# Patient Record
Sex: Male | Born: 1965 | Race: White | Hispanic: No | Marital: Single | Smoking: Current every day smoker
Health system: Southern US, Community
[De-identification: ages and names within clinical notes are randomized; demographics above are authoritative.]

## PROBLEM LIST (undated history)

## (undated) DIAGNOSIS — K759 Inflammatory liver disease, unspecified: Secondary | ICD-10-CM

## (undated) DIAGNOSIS — S3992XA Unspecified injury of lower back, initial encounter: Secondary | ICD-10-CM

## (undated) HISTORY — PX: NO PAST SURGERIES: SHX2092

---

## 2009-10-06 ENCOUNTER — Emergency Department (HOSPITAL_COMMUNITY): Admission: EM | Admit: 2009-10-06 | Discharge: 2009-10-06 | Payer: Self-pay | Admitting: Emergency Medicine

## 2009-12-27 ENCOUNTER — Emergency Department (HOSPITAL_COMMUNITY): Admission: EM | Admit: 2009-12-27 | Discharge: 2009-12-27 | Payer: Self-pay | Admitting: Emergency Medicine

## 2010-01-10 ENCOUNTER — Emergency Department (HOSPITAL_COMMUNITY): Admission: EM | Admit: 2010-01-10 | Discharge: 2010-01-10 | Payer: Self-pay | Admitting: Emergency Medicine

## 2010-01-17 ENCOUNTER — Emergency Department (HOSPITAL_COMMUNITY): Admission: EM | Admit: 2010-01-17 | Discharge: 2010-01-17 | Payer: Self-pay | Admitting: Emergency Medicine

## 2010-10-07 LAB — URINALYSIS, ROUTINE W REFLEX MICROSCOPIC
Nitrite: NEGATIVE
Urobilinogen, UA: 0.2 mg/dL (ref 0.0–1.0)

## 2011-04-02 ENCOUNTER — Emergency Department (HOSPITAL_COMMUNITY): Payer: Self-pay

## 2011-04-02 ENCOUNTER — Emergency Department (HOSPITAL_COMMUNITY)
Admission: EM | Admit: 2011-04-02 | Discharge: 2011-04-02 | Disposition: A | Discharge status: Home / Self Care | Payer: Self-pay | Attending: Emergency Medicine | Admitting: Emergency Medicine

## 2011-04-02 DIAGNOSIS — R4182 Altered mental status, unspecified: Secondary | ICD-10-CM | POA: Insufficient documentation

## 2011-04-02 DIAGNOSIS — F1910 Other psychoactive substance abuse, uncomplicated: Secondary | ICD-10-CM | POA: Insufficient documentation

## 2011-04-02 LAB — COMPREHENSIVE METABOLIC PANEL
ALT: 26 U/L (ref 0–53)
AST: 30 U/L (ref 0–37)
Albumin: 3.8 g/dL (ref 3.5–5.2)
Alkaline Phosphatase: 68 U/L (ref 39–117)
BUN: 15 mg/dL (ref 6–23)
CO2: 29 mEq/L (ref 19–32)
Calcium: 9.1 mg/dL (ref 8.4–10.5)
Chloride: 101 mEq/L (ref 96–112)
Creatinine, Ser: 1.18 mg/dL (ref 0.50–1.35)
GFR calc Af Amer: >60 mL/min (ref >60)
GFR calc non Af Amer: >60 mL/min (ref >60)
Glucose, Bld: 90 mg/dL (ref 70–99)
Potassium: 4.1 mEq/L (ref 3.5–5.1)
Sodium: 137 mEq/L (ref 135–145)
Total Bilirubin: 0.4 mg/dL (ref 0.3–1.2)
Total Protein: 6.8 g/dL (ref 6.0–8.3)

## 2011-04-02 LAB — RAPID URINE DRUG SCREEN, HOSP PERFORMED: Amphetamines: NOT DETECTED

## 2011-04-02 LAB — CBC
MCV: 87.8 fL (ref 78.0–100.0)
Platelets: 259 10*3/uL (ref 150–400)
RBC: 5.25 MIL/uL (ref 4.22–5.81)

## 2011-04-02 LAB — ETHANOL: Alcohol, Ethyl (B): <11 mg/dL (ref 0–11)

## 2012-06-09 ENCOUNTER — Encounter (HOSPITAL_COMMUNITY): Payer: Self-pay

## 2012-06-09 ENCOUNTER — Emergency Department (HOSPITAL_COMMUNITY): Payer: No Typology Code available for payment source

## 2012-06-09 ENCOUNTER — Emergency Department (HOSPITAL_COMMUNITY)
Admission: EM | Admit: 2012-06-09 | Discharge: 2012-06-09 | Disposition: A | Discharge status: Home / Self Care | Payer: No Typology Code available for payment source | Attending: Emergency Medicine | Admitting: Emergency Medicine

## 2012-06-09 DIAGNOSIS — F17200 Nicotine dependence, unspecified, uncomplicated: Secondary | ICD-10-CM | POA: Insufficient documentation

## 2012-06-09 DIAGNOSIS — S139XXA Sprain of joints and ligaments of unspecified parts of neck, initial encounter: Secondary | ICD-10-CM | POA: Insufficient documentation

## 2012-06-09 DIAGNOSIS — Y929 Unspecified place or not applicable: Secondary | ICD-10-CM | POA: Insufficient documentation

## 2012-06-09 DIAGNOSIS — S161XXA Strain of muscle, fascia and tendon at neck level, initial encounter: Secondary | ICD-10-CM

## 2012-06-09 DIAGNOSIS — Y939 Activity, unspecified: Secondary | ICD-10-CM | POA: Insufficient documentation

## 2012-06-09 DIAGNOSIS — S4980XA Other specified injuries of shoulder and upper arm, unspecified arm, initial encounter: Secondary | ICD-10-CM | POA: Insufficient documentation

## 2012-06-09 DIAGNOSIS — S46909A Unspecified injury of unspecified muscle, fascia and tendon at shoulder and upper arm level, unspecified arm, initial encounter: Secondary | ICD-10-CM | POA: Insufficient documentation

## 2012-06-09 MED ORDER — HYDROCODONE-ACETAMINOPHEN 5-500 MG PO TABS: 1.0000 | ORAL_TABLET | Freq: Four times a day (QID) | ORAL | Status: DC | PRN
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
## 2012-06-17 ENCOUNTER — Encounter (HOSPITAL_COMMUNITY): Payer: Self-pay | Admitting: *Deleted

## 2012-06-17 ENCOUNTER — Emergency Department (HOSPITAL_COMMUNITY)
Admission: EM | Admit: 2012-06-17 | Discharge: 2012-06-17 | Disposition: A | Discharge status: Home / Self Care | Payer: No Typology Code available for payment source | Attending: Emergency Medicine | Admitting: Emergency Medicine

## 2012-06-17 DIAGNOSIS — F17200 Nicotine dependence, unspecified, uncomplicated: Secondary | ICD-10-CM | POA: Insufficient documentation

## 2012-06-17 DIAGNOSIS — M25519 Pain in unspecified shoulder: Secondary | ICD-10-CM | POA: Insufficient documentation

## 2012-06-17 DIAGNOSIS — M2550 Pain in unspecified joint: Secondary | ICD-10-CM | POA: Insufficient documentation

## 2012-06-17 MED ORDER — HYDROCODONE-ACETAMINOPHEN 5-325 MG PO TABS: ORAL_TABLET | ORAL | Status: DC

## 2012-06-17 MED ORDER — METHOCARBAMOL 500 MG PO TABS: 500.0000 mg | ORAL_TABLET | Freq: Three times a day (TID) | ORAL | Status: DC

## 2012-06-17 MED ORDER — MELOXICAM 7.5 MG PO TABS: ORAL_TABLET | ORAL | Status: DC

## 2012-06-17 NOTE — ED Provider Notes (Signed)
Medical screening examination/treatment/procedure(s) were performed by non-physician practitioner and as supervising physician I was immediately available for consultation/collaboration. Devoria Albe, MD, Armando Gang   Ward Givens, MD 06/17/12 2230
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
## 2012-06-17 NOTE — ED Notes (Signed)
EDPa in room with pt. 

## 2012-07-13 ENCOUNTER — Encounter (HOSPITAL_COMMUNITY): Payer: Self-pay | Admitting: *Deleted

## 2012-07-13 ENCOUNTER — Emergency Department (HOSPITAL_COMMUNITY)
Admission: EM | Admit: 2012-07-13 | Discharge: 2012-07-13 | Disposition: A | Discharge status: Home / Self Care | Payer: No Typology Code available for payment source | Attending: Emergency Medicine | Admitting: Emergency Medicine

## 2012-07-13 DIAGNOSIS — F17200 Nicotine dependence, unspecified, uncomplicated: Secondary | ICD-10-CM | POA: Insufficient documentation

## 2012-07-13 DIAGNOSIS — M25519 Pain in unspecified shoulder: Secondary | ICD-10-CM | POA: Insufficient documentation

## 2012-07-13 DIAGNOSIS — M25512 Pain in left shoulder: Secondary | ICD-10-CM

## 2012-07-13 MED ORDER — OXYCODONE-ACETAMINOPHEN 5-325 MG PO TABS: ORAL_TABLET | ORAL | Status: DC
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
## 2012-07-14 NOTE — ED Provider Notes (Signed)
Medical screening examination/treatment/procedure(s) were performed by non-physician practitioner and as supervising physician I was immediately available for consultation/collaboration. Brinleigh Tew, MD, FACEP   Addaline Peplinski L Cheyanna Strick, MD 07/14/12 0114 

## 2012-07-30 ENCOUNTER — Encounter (HOSPITAL_COMMUNITY): Payer: Self-pay | Admitting: *Deleted

## 2012-07-30 ENCOUNTER — Emergency Department (HOSPITAL_COMMUNITY)
Admission: EM | Admit: 2012-07-30 | Discharge: 2012-07-30 | Disposition: A | Discharge status: Home / Self Care | Payer: No Typology Code available for payment source | Attending: Emergency Medicine | Admitting: Emergency Medicine

## 2012-07-30 DIAGNOSIS — G8929 Other chronic pain: Secondary | ICD-10-CM | POA: Insufficient documentation

## 2012-07-30 DIAGNOSIS — Z87828 Personal history of other (healed) physical injury and trauma: Secondary | ICD-10-CM | POA: Insufficient documentation

## 2012-07-30 DIAGNOSIS — M25519 Pain in unspecified shoulder: Secondary | ICD-10-CM | POA: Insufficient documentation

## 2012-07-30 DIAGNOSIS — F17200 Nicotine dependence, unspecified, uncomplicated: Secondary | ICD-10-CM | POA: Insufficient documentation

## 2012-07-30 MED ORDER — OXYCODONE-ACETAMINOPHEN 5-325 MG PO TABS: 1.0000 | ORAL_TABLET | Freq: Four times a day (QID) | ORAL | Status: AC | PRN

## 2012-07-30 MED ORDER — IBUPROFEN 800 MG PO TABS: 800.0000 mg | ORAL_TABLET | Freq: Three times a day (TID) | ORAL | Status: DC
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
## 2012-07-31 NOTE — ED Provider Notes (Signed)
Medical screening examination/treatment/procedure(s) were performed by non-physician practitioner and as supervising physician I was immediately available for consultation/collaboration.   Aster Eckrich M Dhilan Brauer, DO 07/31/12 1943 

## 2012-11-05 ENCOUNTER — Emergency Department (HOSPITAL_COMMUNITY): Payer: Self-pay

## 2012-11-05 ENCOUNTER — Encounter (HOSPITAL_COMMUNITY): Payer: Self-pay

## 2012-11-05 ENCOUNTER — Emergency Department (HOSPITAL_COMMUNITY)
Admission: EM | Admit: 2012-11-05 | Discharge: 2012-11-05 | Disposition: A | Discharge status: Home / Self Care | Payer: Self-pay | Attending: Emergency Medicine | Admitting: Emergency Medicine

## 2012-11-05 DIAGNOSIS — W07XXXA Fall from chair, initial encounter: Secondary | ICD-10-CM | POA: Insufficient documentation

## 2012-11-05 DIAGNOSIS — S32009A Unspecified fracture of unspecified lumbar vertebra, initial encounter for closed fracture: Secondary | ICD-10-CM | POA: Insufficient documentation

## 2012-11-05 DIAGNOSIS — Y9289 Other specified places as the place of occurrence of the external cause: Secondary | ICD-10-CM | POA: Insufficient documentation

## 2012-11-05 DIAGNOSIS — Y9389 Activity, other specified: Secondary | ICD-10-CM | POA: Insufficient documentation

## 2012-11-05 DIAGNOSIS — F17200 Nicotine dependence, unspecified, uncomplicated: Secondary | ICD-10-CM | POA: Insufficient documentation

## 2012-11-05 DIAGNOSIS — W1809XA Striking against other object with subsequent fall, initial encounter: Secondary | ICD-10-CM | POA: Insufficient documentation

## 2012-11-05 MED ORDER — OXYCODONE-ACETAMINOPHEN 5-325 MG PO TABS: 1.0000 | ORAL_TABLET | ORAL | Status: DC | PRN

## 2012-11-05 MED ORDER — OXYCODONE-ACETAMINOPHEN 5-325 MG PO TABS
2.0000 | ORAL_TABLET | Freq: Once | ORAL | Status: DC
  Filled 2012-11-05: qty 2

## 2012-11-05 NOTE — ED Notes (Signed)
Pt reports fell from chair trying to reach a crockpot on the top shelf of a cabinet.  C/O lower back pain.

## 2012-11-07 NOTE — ED Provider Notes (Signed)
History     CSN: [id number]  Arrival date & time 11/05/12  1407   First MD Initiated Contact with Patient 11/05/12 1436      Chief Complaint  Patient presents with  . Back Pain    (Consider location/radiation/quality/duration/timing/severity/associated sxs/prior treatment) HPI Comments: Gabriel Fisher is a 47 y.o. Male presenting with left lateral low back pain he sustained when he fell off a chair reaching for his crockpot,  Landing against the stove edge with his left lower back.  The injury just prior to arrival.  He describes sharp pain that is not radiating and is worse with movement and palpation.  He reports he could not move for about 15 minutes after the injury as it hurt too bad.  He denies any other pain or injury; he did not hit his head and had no loc during the event. He has no numbness or weakness in his legs.   He has taken no medications prior to arrival.  Sitting still relieves his pain.     The history is provided by the patient.    History reviewed. No pertinent past medical history.  History reviewed. No pertinent past surgical history.  No family history on file.  History  Substance Use Topics  . Smoking status: Current Every Day Smoker    Types: Cigarettes  . Smokeless tobacco: Not on file  . Alcohol Use: No      Review of Systems  Constitutional: Negative for fever.  Respiratory: Negative for shortness of breath.   Cardiovascular: Negative for chest pain and leg swelling.  Gastrointestinal: Negative for abdominal pain, constipation and abdominal distention.  Genitourinary: Negative for dysuria, urgency, frequency, flank pain and difficulty urinating.  Musculoskeletal: Positive for back pain. Negative for joint swelling and gait problem.  Skin: Negative for rash.  Neurological: Negative for weakness and numbness.    Allergies  Penicillins  Home Medications   Current Outpatient Rx  Name  Route  Sig  Dispense  Refill  .  oxyCODONE-acetaminophen (PERCOCET/ROXICET) 5-325 MG per tablet   Oral   Take 1-2 tablets by mouth every 4 (four) hours as needed for pain.   30 tablet   0     BP 108/64  Pulse 74  Temp(Src) 98.2 F (36.8 C) (Oral)  Resp 20  Ht 5\' 9"  (1.753 m)  Wt 200 lb (90.719 kg)  BMI 29.52 kg/m2  SpO2 100%  Physical Exam  Nursing note and vitals reviewed. Constitutional: He appears well-developed and well-nourished.  HENT:  Head: Normocephalic.  Eyes: Conjunctivae are normal.  Neck: Normal range of motion. No spinous process tenderness present.  Cardiovascular: Normal rate and intact distal pulses.   Pedal pulses normal.  Pulmonary/Chest: Effort normal.  Abdominal: Soft. Bowel sounds are normal. He exhibits no distension and no mass.  Musculoskeletal: Normal range of motion. He exhibits no edema.       Lumbar back: He exhibits tenderness. He exhibits no swelling, no edema and no spasm.  Neurological: He is alert. He has normal strength. He displays no tremor. No sensory deficit. He exhibits normal muscle tone. Gait normal.  Reflex Scores:      Patellar reflexes are 2+ on the right side and 2+ on the left side.      Achilles reflexes are 2+ on the right side and 2+ on the left side. No strength deficit noted in hip and knee flexor and extensor muscle groups.  Ankle flexion and extension intact.  Skin: Skin is warm and  dry.  Psychiatric: He has a normal mood and affect.    ED Course  Procedures (including critical care time)  Labs Reviewed - No data to display Dg Lumbar Spine Complete  11/05/2012  *RADIOLOGY REPORT*  Clinical Data: Low back pain post fall, especially on left  LUMBAR SPINE - COMPLETE 4+ VIEW  Comparison: 10/06/2009  Findings: Osseous mineralization normal. Slight disc space narrowing and L3-L4. Vertebral body and disc space heights otherwise maintained. Deformity of left transverse process of L2 is new since previous exam but without definite acute fracture plane  consistent with an age indeterminate fracture. Additional deformity of left transverse process of L3 is suspicious for an acute fracture. No additional fractures identified. SI joints symmetric.  IMPRESSION: Suspect acute fracture left transverse process of L3. Deformity left transverse process L2 new since previous exam consistent with an age indeterminate fracture   Original Report Authenticated By: Ulyses Southward, M.D.      1. Lumbar transverse process fracture, closed, initial encounter       MDM  Patients labs and/or radiological studies were viewed and considered during the medical decision making and disposition process.  xrays were reviewed with patient.  He was advised to minimize activity such as weight bearing,  Bending, twisting, work note given for light duty.  He was prescribed percocet,  Encouraged ice tx for the next 2 days,  Then can add heat.  Referral to ortho for a recheck of his injury and to follow as this injury heals - pt to call for appt within the next week.  In interim,  Encouraged return here for any new or worsened sx.  The patient appears reasonably screened and/or stabilized for discharge and I doubt any other medical condition or other [facility] requiring further screening, evaluation, or treatment in the ED at this time prior to discharge.         Burgess Amor, PA-C 11/07/12 [phone]

## 2012-11-07 NOTE — ED Provider Notes (Signed)
Medical screening examination/treatment/procedure(s) were conducted as a shared visit with non-physician practitioner(s) and myself.  I personally evaluated the patient during the encounter.  X-rays reviewed. No neurological deficits.  Fractures noted.  Referral to orthopedics  Donnetta Hutching, MD 11/07/12 [phone]

## 2012-11-09 ENCOUNTER — Encounter (HOSPITAL_COMMUNITY): Payer: Self-pay | Admitting: *Deleted

## 2012-11-09 ENCOUNTER — Emergency Department (HOSPITAL_COMMUNITY)
Admission: EM | Admit: 2012-11-09 | Discharge: 2012-11-09 | Discharge status: Against Medical Advice | Payer: Self-pay | Attending: Emergency Medicine | Admitting: Emergency Medicine

## 2012-11-09 DIAGNOSIS — Z880 Allergy status to penicillin: Secondary | ICD-10-CM | POA: Insufficient documentation

## 2012-11-09 DIAGNOSIS — M549 Dorsalgia, unspecified: Secondary | ICD-10-CM | POA: Insufficient documentation

## 2012-11-09 DIAGNOSIS — F17200 Nicotine dependence, unspecified, uncomplicated: Secondary | ICD-10-CM | POA: Insufficient documentation

## 2012-11-09 MED ORDER — OXYCODONE-ACETAMINOPHEN 7.5-325 MG PO TABS: 1.0000 | ORAL_TABLET | ORAL | Status: DC | PRN
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
## 2012-11-09 NOTE — ED Notes (Signed)
Sent by Dr. Hilda Lias for F/u.  Dr. Hilda Lias to see in ED.

## 2012-11-09 NOTE — Discharge Summary (Signed)
Physician Discharge Summary  Patient ID: Gabriel Fisher MRN: [id number] DOB/AGE: 1966/06/03 47 y.o.  Admit date: 11/09/2012 Discharge date: 11/09/2012  Admission Diagnoses: Fracture transverse process of L2 and L3 on the left  Discharge Diagnoses: same Active Problems:   * No active hospital problems. *   Discharged Condition: good  Hospital Course: He was seen by me in the ER.  He was told of problem and given Rx for pain  Consults: None  Significant Diagnostic Studies: radiology: X-Ray: transverse process fracture of L2 and L3 done on 4/19.  Treatments: Examination, pain medicine  Discharge Exam: Blood pressure 113/71, pulse 72, temperature 97.9 F (36.6 C), temperature source Oral, resp. rate 16, height 5\' 9"  (1.753 m), weight 90.719 kg (200 lb), SpO2 99.00%. General appearance: alert, cooperative and appears stated age  Disposition: 01-Home or Self Care     Medication List    STOP taking these medications       oxyCODONE-acetaminophen 5-325 MG per tablet  Commonly known as:  PERCOCET/ROXICET      TAKE these medications       oxyCODONE-acetaminophen 7.5-325 MG per tablet  Commonly known as:  PERCOCET  Take 1 tablet by mouth every 4 (four) hours as needed for pain.           Follow-up Information   Follow up with Darreld Mclean, MD.   Contact information:   [address] [place] [address] [phone]       Signed: Darreld Mclean 11/09/2012, 12:05 PM

## 2012-12-17 ENCOUNTER — Other Ambulatory Visit (HOSPITAL_COMMUNITY): Payer: Self-pay | Admitting: Orthopaedic Surgery

## 2012-12-17 ENCOUNTER — Ambulatory Visit (HOSPITAL_COMMUNITY)
Admission: RE | Admit: 2012-12-17 | Discharge: 2012-12-17 | Disposition: A | Discharge status: Home / Self Care | Payer: Self-pay | Source: Ambulatory Visit | Attending: Orthopaedic Surgery | Admitting: Orthopaedic Surgery

## 2012-12-17 DIAGNOSIS — S32039A Unspecified fracture of third lumbar vertebra, initial encounter for closed fracture: Secondary | ICD-10-CM

## 2012-12-17 DIAGNOSIS — S32029A Unspecified fracture of second lumbar vertebra, initial encounter for closed fracture: Secondary | ICD-10-CM

## 2012-12-17 DIAGNOSIS — S32009A Unspecified fracture of unspecified lumbar vertebra, initial encounter for closed fracture: Secondary | ICD-10-CM | POA: Insufficient documentation

## 2012-12-17 DIAGNOSIS — W19XXXA Unspecified fall, initial encounter: Secondary | ICD-10-CM | POA: Insufficient documentation

## 2013-08-22 ENCOUNTER — Emergency Department (HOSPITAL_COMMUNITY): Payer: Self-pay

## 2013-08-22 ENCOUNTER — Emergency Department (HOSPITAL_COMMUNITY)
Admission: EM | Admit: 2013-08-22 | Discharge: 2013-08-23 | Disposition: A | Discharge status: Home / Self Care | Payer: Self-pay | Attending: Emergency Medicine | Admitting: Emergency Medicine

## 2013-08-22 ENCOUNTER — Encounter (HOSPITAL_COMMUNITY): Payer: Self-pay | Admitting: Emergency Medicine

## 2013-08-22 DIAGNOSIS — S43036A Inferior dislocation of unspecified humerus, initial encounter: Secondary | ICD-10-CM | POA: Insufficient documentation

## 2013-08-22 DIAGNOSIS — S0083XA Contusion of other part of head, initial encounter: Secondary | ICD-10-CM

## 2013-08-22 DIAGNOSIS — S1093XA Contusion of unspecified part of neck, initial encounter: Secondary | ICD-10-CM

## 2013-08-22 DIAGNOSIS — S43031A Inferior subluxation of right humerus, initial encounter: Secondary | ICD-10-CM

## 2013-08-22 DIAGNOSIS — S52123A Displaced fracture of head of unspecified radius, initial encounter for closed fracture: Secondary | ICD-10-CM

## 2013-08-22 DIAGNOSIS — Y9301 Activity, walking, marching and hiking: Secondary | ICD-10-CM | POA: Insufficient documentation

## 2013-08-22 DIAGNOSIS — S0003XA Contusion of scalp, initial encounter: Secondary | ICD-10-CM | POA: Insufficient documentation

## 2013-08-22 DIAGNOSIS — S52109A Unspecified fracture of upper end of unspecified radius, initial encounter for closed fracture: Secondary | ICD-10-CM | POA: Insufficient documentation

## 2013-08-22 DIAGNOSIS — F17200 Nicotine dependence, unspecified, uncomplicated: Secondary | ICD-10-CM | POA: Insufficient documentation

## 2013-08-22 DIAGNOSIS — IMO0002 Reserved for concepts with insufficient information to code with codable children: Secondary | ICD-10-CM | POA: Insufficient documentation

## 2013-08-22 DIAGNOSIS — S0093XA Contusion of unspecified part of head, initial encounter: Secondary | ICD-10-CM

## 2013-08-22 DIAGNOSIS — Y92410 Unspecified street and highway as the place of occurrence of the external cause: Secondary | ICD-10-CM | POA: Insufficient documentation

## 2013-08-22 DIAGNOSIS — T07XXXA Unspecified multiple injuries, initial encounter: Secondary | ICD-10-CM

## 2013-08-22 MED ORDER — HYDROMORPHONE HCL PF 1 MG/ML IJ SOLN
1.0000 mg | Freq: Once | INTRAMUSCULAR | Status: AC
  Administered 2013-08-22: 1 mg via INTRAMUSCULAR
  Filled 2013-08-22: qty 1

## 2013-08-22 MED ORDER — METHOCARBAMOL 500 MG PO TABS
750.0000 mg | ORAL_TABLET | Freq: Once | ORAL | Status: AC
  Administered 2013-08-22: 750 mg via ORAL
  Filled 2013-08-22: qty 2
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
## 2013-08-22 NOTE — ED Notes (Signed)
Pt states he went to bathroom on way to x-ray, unable to urinate at this time.

## 2013-08-22 NOTE — ED Notes (Signed)
Pt gone for CT at this time
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
## 2013-08-23 ENCOUNTER — Emergency Department (HOSPITAL_COMMUNITY): Payer: Self-pay

## 2013-08-23 LAB — URINALYSIS, ROUTINE W REFLEX MICROSCOPIC
Bilirubin Urine: NEGATIVE
Glucose, UA: NEGATIVE mg/dL
Hgb urine dipstick: NEGATIVE
KETONES UR: NEGATIVE mg/dL
LEUKOCYTES UA: NEGATIVE
NITRITE: NEGATIVE
Protein, ur: NEGATIVE mg/dL
SPECIFIC GRAVITY, URINE: 1.019 (ref 1.005–1.030)
Urobilinogen, UA: 1 mg/dL (ref 0.0–1.0)
pH: 6.5 (ref 5.0–8.0)

## 2013-08-23 MED ORDER — HYDROMORPHONE HCL PF 1 MG/ML IJ SOLN
1.0000 mg | Freq: Once | INTRAMUSCULAR | Status: AC
  Administered 2013-08-23: 1 mg via INTRAMUSCULAR
  Filled 2013-08-23: qty 1

## 2013-08-23 MED ORDER — HYDROCODONE-ACETAMINOPHEN 5-325 MG PO TABS: 1.0000 | ORAL_TABLET | ORAL | Status: DC | PRN

## 2013-08-23 NOTE — Discharge Instructions (Signed)
1. Medications: vicodin for pain, usual home medications 2. Treatment: rest, drink plenty of fluids, keep splint clean and dry, keep wounds clean with warm soap and water 3. Follow Up: Please followup with Dr. Sherlean FootLucey for further evaluation and management of your injuries   Cast or Splint Care Casts and splints support injured limbs and keep bones from moving while they heal.  HOME CARE  Keep the cast or splint uncovered during the drying period.  A plaster cast can take 24 to 48 hours to dry.  A fiberglass cast will dry in less than 1 hour.  Do not rest the cast on anything harder than a pillow for 24 hours.  Do not put weight on your injured limb. Do not put pressure on the cast. Wait for your doctor's approval.  Keep the cast or splint dry.  Cover the cast or splint with a plastic bag during baths or wet weather.  If you have a cast over your chest and belly (trunk), take sponge baths until the cast is taken off.  If your cast gets wet, dry it with a towel or blow dryer. Use the cool setting on the blow dryer.  Keep your cast or splint clean. Wash a dirty cast with a damp cloth.  Do not put any objects under your cast or splint.  Do not scratch the skin under the cast with an object. If itching is a problem, use a blow dryer on a cool setting over the itchy area.  Do not trim or cut your cast.  Do not take out the padding from inside your cast.  Exercise your joints near the cast as told by your doctor.  Raise (elevate) your injured limb on 1 or 2 pillows for the first 1 to 3 days. GET HELP IF:  Your cast or splint cracks.  Your cast or splint is too tight or too loose.  You itch badly under the cast.  Your cast gets wet or has a soft spot.  You have a bad smell coming from the cast.  You get an object stuck under the cast.  Your skin around the cast becomes red or sore.  You have new or more pain after the cast is put on. GET HELP RIGHT AWAY IF:  You have  fluid leaking through the cast.  You cannot move your fingers or toes.  Your fingers or toes turn blue or white or are cool, painful, or puffy (swollen).  You have tingling or lose feeling (numbness) around the injured area.  You have bad pain or pressure under the cast.  You have trouble breathing or have shortness of breath.  You have chest pain. Document Released: 11/07/2010 Document Revised: 03/10/2013 Document Reviewed: 01/14/2013 [facility] Patient Information 2014 [employer], [place].

## 2013-08-23 NOTE — Progress Notes (Signed)
Orthopedic Tech Progress Note Patient Details:  Gabriel Fisher 02/11/1966 [id number]  Ortho Devices Type of Ortho Device: Arm sling;Sugartong splint   [name], [name] 08/23/2013, 1:26 AM

## 2013-08-23 NOTE — ED Notes (Signed)
Ortho tech called to apply splint and sling. 

## 2013-08-24 NOTE — ED Provider Notes (Signed)
Medical screening examination/treatment/procedure(s) were performed by non-physician practitioner and as supervising physician I was immediately available for consultation/collaboration.  EKG Interpretation   None         Lynell Kussman M Kentley Blyden, MD 08/24/13 1618 

## 2013-08-25 ENCOUNTER — Emergency Department (HOSPITAL_COMMUNITY): Payer: Self-pay

## 2013-08-25 ENCOUNTER — Emergency Department (HOSPITAL_COMMUNITY): Payer: No Typology Code available for payment source

## 2013-08-25 ENCOUNTER — Encounter (HOSPITAL_COMMUNITY): Payer: Self-pay | Admitting: Emergency Medicine

## 2013-08-25 ENCOUNTER — Inpatient Hospital Stay (HOSPITAL_COMMUNITY)
Admission: EM | Admit: 2013-08-25 | Discharge: 2013-08-27 | DRG: 443 | Disposition: A | Discharge status: Home / Self Care | Payer: Self-pay | Attending: General Surgery | Admitting: General Surgery

## 2013-08-25 DIAGNOSIS — S36119A Unspecified injury of liver, initial encounter: Secondary | ICD-10-CM

## 2013-08-25 DIAGNOSIS — D72829 Elevated white blood cell count, unspecified: Secondary | ICD-10-CM

## 2013-08-25 DIAGNOSIS — T391X5A Adverse effect of 4-Aminophenol derivatives, initial encounter: Secondary | ICD-10-CM | POA: Diagnosis present

## 2013-08-25 DIAGNOSIS — M25519 Pain in unspecified shoulder: Secondary | ICD-10-CM | POA: Diagnosis present

## 2013-08-25 DIAGNOSIS — B1920 Unspecified viral hepatitis C without hepatic coma: Secondary | ICD-10-CM | POA: Diagnosis present

## 2013-08-25 DIAGNOSIS — R7989 Other specified abnormal findings of blood chemistry: Secondary | ICD-10-CM

## 2013-08-25 DIAGNOSIS — S40011A Contusion of right shoulder, initial encounter: Secondary | ICD-10-CM | POA: Diagnosis present

## 2013-08-25 DIAGNOSIS — Y92009 Unspecified place in unspecified non-institutional (private) residence as the place of occurrence of the external cause: Secondary | ICD-10-CM

## 2013-08-25 DIAGNOSIS — K7689 Other specified diseases of liver: Principal | ICD-10-CM | POA: Diagnosis present

## 2013-08-25 DIAGNOSIS — S5291XA Unspecified fracture of right forearm, initial encounter for closed fracture: Secondary | ICD-10-CM | POA: Diagnosis present

## 2013-08-25 DIAGNOSIS — F17200 Nicotine dependence, unspecified, uncomplicated: Secondary | ICD-10-CM | POA: Diagnosis present

## 2013-08-25 DIAGNOSIS — R1011 Right upper quadrant pain: Secondary | ICD-10-CM

## 2013-08-25 DIAGNOSIS — S52123A Displaced fracture of head of unspecified radius, initial encounter for closed fracture: Secondary | ICD-10-CM | POA: Diagnosis present

## 2013-08-25 DIAGNOSIS — K759 Inflammatory liver disease, unspecified: Secondary | ICD-10-CM | POA: Diagnosis present

## 2013-08-25 DIAGNOSIS — K760 Fatty (change of) liver, not elsewhere classified: Secondary | ICD-10-CM | POA: Diagnosis present

## 2013-08-25 DIAGNOSIS — R945 Abnormal results of liver function studies: Secondary | ICD-10-CM

## 2013-08-25 DIAGNOSIS — IMO0002 Reserved for concepts with insufficient information to code with codable children: Secondary | ICD-10-CM

## 2013-08-25 HISTORY — DX: Unspecified injury of lower back, initial encounter: S39.92XA

## 2013-08-25 LAB — CBC WITH DIFFERENTIAL/PLATELET
BASOS PCT: 1 % (ref 0–1)
Basophils Absolute: 0.2 10*3/uL — ABNORMAL HIGH (ref 0.0–0.1)
EOS PCT: 1 % (ref 0–5)
Eosinophils Absolute: 0.2 10*3/uL (ref 0.0–0.7)
HEMATOCRIT: 47.5 % (ref 39.0–52.0)
Hemoglobin: 16 g/dL (ref 13.0–17.0)
LYMPHS PCT: 63 % — AB (ref 12–46)
Lymphs Abs: 10.6 10*3/uL — ABNORMAL HIGH (ref 0.7–4.0)
MCH: 29.2 pg (ref 26.0–34.0)
MCHC: 33.7 g/dL (ref 30.0–36.0)
MCV: 86.7 fL (ref 78.0–100.0)
MONOS PCT: 8 % (ref 3–12)
Monocytes Absolute: 1.3 10*3/uL — ABNORMAL HIGH (ref 0.1–1.0)
NEUTROS ABS: 4.5 10*3/uL (ref 1.7–7.7)
NEUTROS PCT: 27 % — AB (ref 43–77)
Platelets: 185 10*3/uL (ref 150–400)
RBC: 5.48 MIL/uL (ref 4.22–5.81)
RDW: 14.1 % (ref 11.5–15.5)
WBC: 16.8 10*3/uL — ABNORMAL HIGH (ref 4.0–10.5)

## 2013-08-25 LAB — URINALYSIS, ROUTINE W REFLEX MICROSCOPIC
Glucose, UA: NEGATIVE mg/dL
Hgb urine dipstick: NEGATIVE
Ketones, ur: NEGATIVE mg/dL
Leukocytes, UA: NEGATIVE
Nitrite: NEGATIVE
PH: 5.5 (ref 5.0–8.0)
PROTEIN: NEGATIVE mg/dL
Specific Gravity, Urine: 1.02 (ref 1.005–1.030)
Urobilinogen, UA: 1 mg/dL (ref 0.0–1.0)

## 2013-08-25 LAB — PROTIME-INR
INR: 1.08 (ref 0.00–1.49)
Prothrombin Time: 13.8 seconds (ref 11.6–15.2)

## 2013-08-25 LAB — COMPREHENSIVE METABOLIC PANEL
ALK PHOS: 198 U/L — AB (ref 39–117)
ALT: 1934 U/L — AB (ref 0–53)
AST: 1215 U/L — ABNORMAL HIGH (ref 0–37)
Albumin: 2.9 g/dL — ABNORMAL LOW (ref 3.5–5.2)
BILIRUBIN TOTAL: 2.8 mg/dL — AB (ref 0.3–1.2)
BUN: 11 mg/dL (ref 6–23)
CO2: 24 meq/L (ref 19–32)
Calcium: 8.3 mg/dL — ABNORMAL LOW (ref 8.4–10.5)
Chloride: 100 mEq/L (ref 96–112)
Creatinine, Ser: 0.97 mg/dL (ref 0.50–1.35)
GLUCOSE: 188 mg/dL — AB (ref 70–99)
POTASSIUM: 4.2 meq/L (ref 3.7–5.3)
SODIUM: 135 meq/L — AB (ref 137–147)
TOTAL PROTEIN: 6.5 g/dL (ref 6.0–8.3)

## 2013-08-25 LAB — ACETAMINOPHEN LEVEL

## 2013-08-25 LAB — APTT: aPTT: 27 seconds (ref 24–37)

## 2013-08-25 LAB — LIPASE, BLOOD: Lipase: 23 U/L (ref 11–59)

## 2013-08-25 MED ORDER — ONDANSETRON HCL 4 MG/2ML IJ SOLN
4.0000 mg | Freq: Once | INTRAMUSCULAR | Status: AC
  Administered 2013-08-25: 4 mg via INTRAVENOUS
  Filled 2013-08-25: qty 2

## 2013-08-25 MED ORDER — HYDROMORPHONE HCL PF 1 MG/ML IJ SOLN
1.0000 mg | Freq: Once | INTRAMUSCULAR | Status: AC
  Administered 2013-08-25: 1 mg via INTRAVENOUS
  Filled 2013-08-25: qty 1

## 2013-08-25 MED ORDER — SODIUM CHLORIDE 0.9 % IV BOLUS (SEPSIS)
1000.0000 mL | Freq: Once | INTRAVENOUS | Status: AC
  Administered 2013-08-25: 1000 mL via INTRAVENOUS

## 2013-08-25 MED ORDER — IOHEXOL 300 MG/ML  SOLN
100.0000 mL | Freq: Once | INTRAMUSCULAR | Status: AC | PRN
  Administered 2013-08-25: 100 mL via INTRAVENOUS

## 2013-08-25 MED ORDER — CEFEPIME HCL 1 G IJ SOLR
1.0000 g | Freq: Two times a day (BID) | INTRAMUSCULAR | Status: DC
  Administered 2013-08-25: 1 g via INTRAVENOUS
  Filled 2013-08-25 (×3): qty 1
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
## 2013-08-25 NOTE — ED Notes (Signed)
Called ultrasound about pt. Results, no answer. Will try back and look for second number.

## 2013-08-25 NOTE — ED Notes (Signed)
Pt ambulated to bathroom 

## 2013-08-25 NOTE — ED Notes (Signed)
[facility] called.  Attempted to call report, RN unable to take it. Will call back.
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
## 2013-08-25 NOTE — ED Notes (Signed)
Called radiology about results, Restults for ultrasound have still not been read.

## 2013-08-25 NOTE — ED Notes (Signed)
Called pharmacy about Cefepime. Pharmacy to send.
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
## 2013-08-26 ENCOUNTER — Inpatient Hospital Stay (HOSPITAL_COMMUNITY): Payer: Self-pay

## 2013-08-26 DIAGNOSIS — S5291XA Unspecified fracture of right forearm, initial encounter for closed fracture: Secondary | ICD-10-CM | POA: Diagnosis present

## 2013-08-26 DIAGNOSIS — S5290XA Unspecified fracture of unspecified forearm, initial encounter for closed fracture: Secondary | ICD-10-CM

## 2013-08-26 DIAGNOSIS — K759 Inflammatory liver disease, unspecified: Secondary | ICD-10-CM

## 2013-08-26 LAB — COMPREHENSIVE METABOLIC PANEL
ALT: 1497 U/L — ABNORMAL HIGH (ref 0–53)
AST: 780 U/L — AB (ref 0–37)
Albumin: 2.6 g/dL — ABNORMAL LOW (ref 3.5–5.2)
Alkaline Phosphatase: 179 U/L — ABNORMAL HIGH (ref 39–117)
BILIRUBIN TOTAL: 2.5 mg/dL — AB (ref 0.3–1.2)
BUN: 7 mg/dL (ref 6–23)
CHLORIDE: 106 meq/L (ref 96–112)
CO2: 23 meq/L (ref 19–32)
CREATININE: 0.84 mg/dL (ref 0.50–1.35)
Calcium: 7.8 mg/dL — ABNORMAL LOW (ref 8.4–10.5)
GFR calc Af Amer: >90 mL/min (ref >90)
Glucose, Bld: 149 mg/dL — ABNORMAL HIGH (ref 70–99)
Potassium: 4.6 mEq/L (ref 3.7–5.3)
Sodium: 141 mEq/L (ref 137–147)
Total Protein: 6 g/dL (ref 6.0–8.3)

## 2013-08-26 LAB — CBC
HEMATOCRIT: 46.8 % (ref 39.0–52.0)
HEMOGLOBIN: 15.8 g/dL (ref 13.0–17.0)
MCH: 29.8 pg (ref 26.0–34.0)
MCHC: 33.8 g/dL (ref 30.0–36.0)
MCV: 88.3 fL (ref 78.0–100.0)
Platelets: 181 10*3/uL (ref 150–400)
RBC: 5.3 MIL/uL (ref 4.22–5.81)
RDW: 14.5 % (ref 11.5–15.5)
WBC: 16 10*3/uL — AB (ref 4.0–10.5)

## 2013-08-26 LAB — CBC WITH DIFFERENTIAL/PLATELET
Basophils Absolute: 0 10*3/uL (ref 0.0–0.1)
Basophils Relative: 0 % (ref 0–1)
Eosinophils Absolute: 0.3 10*3/uL (ref 0.0–0.7)
Eosinophils Relative: 2 % (ref 0–5)
HCT: 46.1 % (ref 39.0–52.0)
Hemoglobin: 15.4 g/dL (ref 13.0–17.0)
Lymphocytes Relative: 64 % — ABNORMAL HIGH (ref 12–46)
Lymphs Abs: 9.9 10*3/uL — ABNORMAL HIGH (ref 0.7–4.0)
MCH: 29.3 pg (ref 26.0–34.0)
MCHC: 33.4 g/dL (ref 30.0–36.0)
MCV: 87.6 fL (ref 78.0–100.0)
MONO ABS: 1.2 10*3/uL — AB (ref 0.1–1.0)
Monocytes Relative: 8 % (ref 3–12)
Neutro Abs: 4 10*3/uL (ref 1.7–7.7)
Neutrophils Relative %: 26 % — ABNORMAL LOW (ref 43–77)
PLATELETS: 180 10*3/uL (ref 150–400)
RBC: 5.26 MIL/uL (ref 4.22–5.81)
RDW: 14.4 % (ref 11.5–15.5)
WBC: 15.4 10*3/uL — ABNORMAL HIGH (ref 4.0–10.5)

## 2013-08-26 LAB — IRON AND TIBC
Iron: 105 ug/dL (ref 42–135)
SATURATION RATIOS: 35 % (ref 20–55)
TIBC: 297 ug/dL (ref 215–435)
UIBC: 192 ug/dL (ref 125–400)

## 2013-08-26 LAB — PATHOLOGIST SMEAR REVIEW: PATH REVIEW: REACTIVE

## 2013-08-26 MED ORDER — DIPHENHYDRAMINE HCL 12.5 MG/5ML PO ELIX: 12.5000 mg | ORAL_SOLUTION | Freq: Four times a day (QID) | ORAL | Status: DC | PRN

## 2013-08-26 MED ORDER — KCL IN DEXTROSE-NACL 20-5-0.45 MEQ/L-%-% IV SOLN
INTRAVENOUS | Status: DC
  Administered 2013-08-26: 01:00:00 via INTRAVENOUS
  Filled 2013-08-26 (×4): qty 1000

## 2013-08-26 MED ORDER — DEXTROSE 5 % IV SOLN
1.0000 g | Freq: Two times a day (BID) | INTRAVENOUS | Status: DC
  Administered 2013-08-26 (×2): 1 g via INTRAVENOUS
  Filled 2013-08-26 (×4): qty 1

## 2013-08-26 MED ORDER — MORPHINE SULFATE 2 MG/ML IJ SOLN
2.0000 mg | INTRAMUSCULAR | Status: DC | PRN
  Administered 2013-08-26: 2 mg via INTRAVENOUS
  Filled 2013-08-26: qty 1

## 2013-08-26 MED ORDER — ENOXAPARIN SODIUM 40 MG/0.4ML ~~LOC~~ SOLN
40.0000 mg | SUBCUTANEOUS | Status: DC
  Administered 2013-08-26 – 2013-08-27 (×2): 40 mg via SUBCUTANEOUS
  Filled 2013-08-26 (×4): qty 0.4

## 2013-08-26 MED ORDER — ONDANSETRON HCL 4 MG/2ML IJ SOLN
4.0000 mg | Freq: Four times a day (QID) | INTRAMUSCULAR | Status: DC | PRN
  Administered 2013-08-26 – 2013-08-27 (×3): 4 mg via INTRAVENOUS
  Filled 2013-08-26 (×3): qty 2

## 2013-08-26 MED ORDER — PANTOPRAZOLE SODIUM 40 MG IV SOLR
40.0000 mg | Freq: Every day | INTRAVENOUS | Status: DC
  Filled 2013-08-26: qty 40

## 2013-08-26 MED ORDER — OXYCODONE HCL 5 MG PO TABS
5.0000 mg | ORAL_TABLET | ORAL | Status: DC | PRN
  Administered 2013-08-26 – 2013-08-27 (×4): 15 mg via ORAL
  Filled 2013-08-26 (×4): qty 3

## 2013-08-26 MED ORDER — DIPHENHYDRAMINE HCL 50 MG/ML IJ SOLN
12.5000 mg | Freq: Four times a day (QID) | INTRAMUSCULAR | Status: DC | PRN
  Administered 2013-08-26 – 2013-08-27 (×3): 25 mg via INTRAVENOUS
  Filled 2013-08-26 (×3): qty 1

## 2013-08-26 MED ORDER — DOCUSATE SODIUM 100 MG PO CAPS
100.0000 mg | ORAL_CAPSULE | Freq: Two times a day (BID) | ORAL | Status: DC
  Administered 2013-08-26 – 2013-08-27 (×3): 100 mg via ORAL
  Filled 2013-08-26 (×2): qty 1

## 2013-08-26 NOTE — Progress Notes (Signed)
UR completed.  Shahidah Nesbitt, RN BSN [facility] CCM Trauma/Neuro ICU Case Manager [phone]  
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
## 2013-08-26 NOTE — Progress Notes (Signed)
Abdominal pain has resolved. Abdomen nontender. Tolerated clears and is hungry. LFTs gradually improving. Advance diet and Dr. Carola FrostHandy will eval radius FX. Patient examined and I agree with the assessment and plan  Violeta GelinasBurke Lennox Leikam, MD, MPH, FACS Pager: [phone]  08/26/2013 11:02 AM
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
## 2013-08-27 DIAGNOSIS — S40011A Contusion of right shoulder, initial encounter: Secondary | ICD-10-CM | POA: Diagnosis present

## 2013-08-27 DIAGNOSIS — K760 Fatty (change of) liver, not elsewhere classified: Secondary | ICD-10-CM | POA: Diagnosis present

## 2013-08-27 LAB — HEPATITIS PANEL, ACUTE
HCV Ab: REACTIVE — AB
Hep A IgM: NONREACTIVE
Hep B C IgM: NONREACTIVE
Hepatitis B Surface Ag: NEGATIVE

## 2013-08-27 LAB — COMPREHENSIVE METABOLIC PANEL
ALBUMIN: 2.6 g/dL — AB (ref 3.5–5.2)
ALT: 1360 U/L — ABNORMAL HIGH (ref 0–53)
AST: 641 U/L — AB (ref 0–37)
Alkaline Phosphatase: 204 U/L — ABNORMAL HIGH (ref 39–117)
BUN: 12 mg/dL (ref 6–23)
CALCIUM: 8.2 mg/dL — AB (ref 8.4–10.5)
CO2: 23 mEq/L (ref 19–32)
Chloride: 103 mEq/L (ref 96–112)
Creatinine, Ser: 0.92 mg/dL (ref 0.50–1.35)
GFR calc non Af Amer: >90 mL/min (ref >90)
Glucose, Bld: 134 mg/dL — ABNORMAL HIGH (ref 70–99)
Potassium: 4.4 mEq/L (ref 3.7–5.3)
SODIUM: 138 meq/L (ref 137–147)
Total Bilirubin: 2.2 mg/dL — ABNORMAL HIGH (ref 0.3–1.2)
Total Protein: 6.4 g/dL (ref 6.0–8.3)

## 2013-08-27 MED ORDER — OXYCODONE HCL 10 MG PO TABS: 10.0000 mg | ORAL_TABLET | ORAL | Status: DC | PRN

## 2013-08-27 NOTE — Discharge Instructions (Signed)
No lifting, pushing, or pulling with right arm.  No driving while taking oxycodone.  Avoid tylenol and medications containing tylenol.  Establish with a primary care provider to discuss your fatty liver and to make sure your liver function tests return to normal.
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
## 2013-08-27 NOTE — Progress Notes (Signed)
Good for discharge.  This patient has been seen and I agree with the findings and treatment plan.  Jaileen Janelle O. Audianna Landgren, III, MD, FACS [phone] (pager) [phone] (direct pager) Trauma Surgeon  
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
## 2013-08-27 NOTE — Discharge Summary (Signed)
Stable for discharge  This patient has been seen and I agree with the findings and treatment plan.  Marta LamasJames O. Gae BonWyatt, III, MD, FACS [phone] (pager) [phone] (direct pager) Trauma Surgeon
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
## 2013-08-27 NOTE — Progress Notes (Signed)
Patient ID: Gabriel Fisher, male   DOB: 11/21/1965, 48 y.o.   MRN: [id number]   LOS: 2 days   Subjective: Feeling a bit better. Tolerated regular diet, denies N/V.   Objective: Vital signs in last 24 hours: Temp:  [98.5 F (36.9 C)-98.6 F (37 C)] 98.5 F (36.9 C) (02/06 0525) Pulse Rate:  [63-65] 63 (02/06 0525) Resp:  [18] 18 (02/06 0525) BP: (114-119)/(65-69) 119/65 mmHg (02/06 0525) SpO2:  [96 %-98 %] 96 % (02/06 0525) Last BM Date: 08/25/13   Laboratory  CMP     Component Value Date/Time   NA 138 08/27/2013 0335   K 4.4 08/27/2013 0335   CL 103 08/27/2013 0335   CO2 23 08/27/2013 0335   GLUCOSE 134* 08/27/2013 0335   BUN 12 08/27/2013 0335   CREATININE 0.92 08/27/2013 0335   CALCIUM 8.2* 08/27/2013 0335   PROT 6.4 08/27/2013 0335   ALBUMIN 2.6* 08/27/2013 0335   AST 641* 08/27/2013 0335   ALT 1360* 08/27/2013 0335   ALKPHOS 204* 08/27/2013 0335   BILITOT 2.2* 08/27/2013 0335   GFRNONAA >90 08/27/2013 0335   GFRAA >90 08/27/2013 0335   Fe/TIBC: 35%  Hepatitis Panel All negative except HCV ab +   Physical Exam General appearance: alert and no distress Resp: clear to auscultation bilaterally Cardio: regular rate and rhythm GI: Soft, mild TTP RUQ, +BS   Assessment/Plan: PHBC  Hepatitis -- Will check HCV RNA to r/o active Hep C infection. If that is negative I favor tylenol toxicity in conjunction with his steatosis which puts him at increased risk. If positive will refer to ID as OP. Right elbow fx -- per Dr. Carola FrostHandy Right shoulder contusion -- per Dr. Georgianne FickHandy  Dispo -- Home today after ortho sees and clears    Freeman CaldronMichael J. Lyndol Vanderheiden, PA-C Pager: [phone] General Trauma PA Pager: [phone]   08/27/2013

## 2013-08-27 NOTE — Progress Notes (Signed)
Discharge instructions and prescription for oxycodone given to pt. Pt. Verbalized understanding of all discharge instructions/orders.  IV removed.  Pt. Had no questions regarding discharge.  Discharged to home with wife. Vanice Sarahhompson, Laquanda Bick L

## 2013-08-30 ENCOUNTER — Telehealth (INDEPENDENT_AMBULATORY_CARE_PROVIDER_SITE_OTHER): Payer: Self-pay | Admitting: Orthopedic Surgery

## 2013-08-30 LAB — HCV RNA QUANT
HCV Quantitative Log: 5.02 {Log} — ABNORMAL HIGH (ref <1.18)
HCV Quantitative: 105275 IU/mL — ABNORMAL HIGH (ref <15)

## 2013-09-01 ENCOUNTER — Emergency Department (HOSPITAL_COMMUNITY)
Admission: EM | Admit: 2013-09-01 | Discharge: 2013-09-01 | Disposition: A | Discharge status: Home / Self Care | Payer: No Typology Code available for payment source | Attending: Emergency Medicine | Admitting: Emergency Medicine

## 2013-09-01 ENCOUNTER — Emergency Department (HOSPITAL_COMMUNITY): Payer: No Typology Code available for payment source

## 2013-09-01 ENCOUNTER — Encounter (HOSPITAL_COMMUNITY): Payer: Self-pay | Admitting: Emergency Medicine

## 2013-09-01 DIAGNOSIS — F1010 Alcohol abuse, uncomplicated: Secondary | ICD-10-CM

## 2013-09-01 DIAGNOSIS — F17200 Nicotine dependence, unspecified, uncomplicated: Secondary | ICD-10-CM | POA: Insufficient documentation

## 2013-09-01 DIAGNOSIS — Z880 Allergy status to penicillin: Secondary | ICD-10-CM | POA: Insufficient documentation

## 2013-09-01 DIAGNOSIS — Z8719 Personal history of other diseases of the digestive system: Secondary | ICD-10-CM | POA: Insufficient documentation

## 2013-09-01 DIAGNOSIS — S52123A Displaced fracture of head of unspecified radius, initial encounter for closed fracture: Secondary | ICD-10-CM

## 2013-09-01 DIAGNOSIS — IMO0002 Reserved for concepts with insufficient information to code with codable children: Secondary | ICD-10-CM | POA: Insufficient documentation

## 2013-09-01 DIAGNOSIS — Y92410 Unspecified street and highway as the place of occurrence of the external cause: Secondary | ICD-10-CM | POA: Insufficient documentation

## 2013-09-01 DIAGNOSIS — Y9389 Activity, other specified: Secondary | ICD-10-CM | POA: Insufficient documentation

## 2013-09-01 DIAGNOSIS — M549 Dorsalgia, unspecified: Secondary | ICD-10-CM

## 2013-09-01 DIAGNOSIS — IMO0001 Reserved for inherently not codable concepts without codable children: Secondary | ICD-10-CM | POA: Insufficient documentation

## 2013-09-01 HISTORY — DX: Inflammatory liver disease, unspecified: K75.9

## 2013-09-01 MED ORDER — FENTANYL CITRATE 0.05 MG/ML IJ SOLN
50.0000 ug | Freq: Once | INTRAMUSCULAR | Status: AC
  Administered 2013-09-01: 50 ug via INTRAVENOUS
  Filled 2013-09-01: qty 2

## 2013-09-01 MED ORDER — ONDANSETRON 8 MG PO TBDP
8.0000 mg | ORAL_TABLET | Freq: Once | ORAL | Status: AC
  Administered 2013-09-01: 8 mg via ORAL
  Filled 2013-09-01: qty 1

## 2013-09-01 MED ORDER — OXYCODONE-ACETAMINOPHEN 5-325 MG PO TABS
2.0000 | ORAL_TABLET | Freq: Once | ORAL | Status: AC
  Administered 2013-09-01: 2 via ORAL
  Filled 2013-09-01: qty 2

## 2013-09-01 MED ORDER — IOHEXOL 300 MG/ML  SOLN
100.0000 mL | Freq: Once | INTRAMUSCULAR | Status: AC | PRN
  Administered 2013-09-01: 100 mL via INTRAVENOUS
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
## 2013-09-01 NOTE — ED Notes (Signed)
Patient transported to CT 

## 2013-09-01 NOTE — ED Notes (Signed)
Patient transported to X-ray 
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
## 2013-09-01 NOTE — ED Provider Notes (Signed)
Medical screening examination/treatment/procedure(s) were performed by non-physician practitioner and as supervising physician I was immediately available for consultation/collaboration.   Sunnie NielsenBrian Lavida Patch, MD 09/01/13 [phone]

## 2013-09-01 NOTE — ED Notes (Signed)
[employer] at bedside 

## 2013-09-01 NOTE — ED Provider Notes (Signed)
Medical screening examination/treatment/procedure(s) were performed by non-physician practitioner and as supervising physician I was immediately available for consultation/collaboration.  EKG Interpretation   None        Sunnie NielsenBrian Jerianne Anselmo, MD 09/01/13 [id number]
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
## 2013-09-01 NOTE — Discharge Instructions (Signed)
Alcohol Problems °Most adults who drink alcohol drink in moderation (not a lot) are at low risk for developing problems related to their drinking. However, all drinkers, including low-risk drinkers, should know about the health risks connected with drinking alcohol. °RECOMMENDATIONS FOR LOW-RISK DRINKING  °Drink in moderation. Moderate drinking is defined as follows:  °· Men - no more than 2 drinks per day. °· Nonpregnant women - no more than 1 drink per day. °· Over age 65 - no more than 1 drink per day. °A standard drink is 12 grams of pure alcohol, which is equal to a 12 ounce bottle of beer or wine cooler, a 5 ounce glass of wine, or 1.5 ounces of distilled spirits (such as whiskey, brandy, vodka, or rum).  °ABSTAIN FROM (DO NOT DRINK) ALCOHOL: °· When pregnant or considering pregnancy. °· When taking a medication that interacts with alcohol. °· If you are alcohol dependent. °· A medical condition that prohibits drinking alcohol (such as ulcer, liver disease, or heart disease). °DISCUSS WITH YOUR CAREGIVER: °· If you are at risk for coronary heart disease, discuss the potential benefits and risks of alcohol use: Light to moderate drinking is associated with lower rates of coronary heart disease in certain populations (for example, men over age 45 and postmenopausal women). Infrequent or nondrinkers are advised not to begin light to moderate drinking to reduce the risk of coronary heart disease so as to avoid creating an alcohol-related problem. Similar protective effects can likely be gained through proper diet and exercise. °· Women and the elderly have smaller amounts of body water than men. As a result women and the elderly achieve a higher blood alcohol concentration after drinking the same amount of alcohol. °· Exposing a fetus to alcohol can cause a broad range of birth defects referred to as Fetal Alcohol Syndrome (FAS) or Alcohol-Related Birth Defects (ARBD). Although FAS/ARBD is connected with excessive  alcohol consumption during pregnancy, studies also have reported neurobehavioral problems in infants born to mothers reporting drinking an average of 1 drink per day during pregnancy. °· Heavier drinking (the consumption of more than 4 drinks per occasion by men and more than 3 drinks per occasion by women) impairs learning (cognitive) and psychomotor functions and increases the risk of alcohol-related problems, including accidents and injuries. °CAGE QUESTIONS:  °· Have you ever felt that you should Cut down on your drinking? °· Have people Annoyed you by criticizing your drinking? °· Have you ever felt bad or Guilty about your drinking? °· Have you ever had a drink first thing in the morning to steady your nerves or get rid of a hangover (Eye opener)? °If you answered positively to any of these questions: You may be at risk for alcohol-related problems if alcohol consumption is:  °· Men: Greater than 14 drinks per week or more than 4 drinks per occasion. °· Women: Greater than 7 drinks per week or more than 3 drinks per occasion. °Do you or your family have a medical history of alcohol-related problems, such as: °· Blackouts. °· Sexual dysfunction. °· Depression. °· Trauma. °· Liver dysfunction. °· Sleep disorders. °· Hypertension. °· Chronic abdominal pain. °· Has your drinking ever caused you problems, such as problems with your family, problems with your work (or school) performance, or accidents/injuries? °· Do you have a compulsion to drink or a preoccupation with drinking? °· Do you have poor control or are you unable to stop drinking once you have started? °· Do you have to drink to   avoid withdrawal symptoms?  Do you have problems with withdrawal such as tremors, nausea, sweats, or mood disturbances?  Does it take more alcohol than in the past to get you high?  Do you feel a strong urge to drink?  Do you change your plans so that you can have a drink?  Do you ever drink in the morning to relieve  the shakes or a hangover? If you have answered a number of the previous questions positively, it may be time for you to talk to your caregivers, family, and friends and see if they think you have a problem. Alcoholism is a chemical dependency that keeps getting worse and will eventually destroy your health and relationships. Many alcoholics end up dead, impoverished, or in prison. This is often the end result of all chemical dependency.  Do not be discouraged if you are not ready to take action immediately.  Decisions to change behavior often involve up and down desires to change and feeling like you cannot decide.  Try to think more seriously about your drinking behavior.  Think of the reasons to quit. WHERE TO GO FOR ADDITIONAL INFORMATION   The National Institute on Alcohol Abuse and Alcoholism (NIAAA) BasicStudents.dkwww.niaaa.nih.gov  [employer] on Alcoholism and Drug Dependence (NCADD) www.ncadd.org  American Society of Addiction Medicine (ASAM) RoyalDiary.glwww.asam.org  Document Released: 07/08/2005 Document Revised: 09/30/2011 Document Reviewed: 02/24/2008 [facility] Patient Information 2014 [place], [place].   Motor Vehicle Collision  It is common to have multiple bruises and sore muscles after a motor vehicle collision (MVC). These tend to feel worse for the first 24 hours. You may have the most stiffness and soreness over the first several hours. You may also feel worse when you wake up the first morning after your collision. After this point, you will usually begin to improve with each day. The speed of improvement often depends on the severity of the collision, the number of injuries, and the location and nature of these injuries. HOME CARE INSTRUCTIONS   Put ice on the injured area.  Put ice in a plastic bag.  Place a towel between your skin and the bag.  Leave the ice on for 15-20 minutes, 03-04 times a day.  Drink enough fluids to keep your urine clear or pale yellow. Do not drink  alcohol.  Take a warm shower or bath once or twice a day. This will increase blood flow to sore muscles.  You may return to activities as directed by your caregiver. Be careful when lifting, as this may aggravate neck or back pain.  Only take over-the-counter or prescription medicines for pain, discomfort, or fever as directed by your caregiver. Do not use aspirin. This may increase bruising and bleeding. SEEK IMMEDIATE MEDICAL CARE IF:  You have numbness, tingling, or weakness in the arms or legs.  You develop severe headaches not relieved with medicine.  You have severe neck pain, especially tenderness in the middle of the back of your neck.  You have changes in bowel or bladder control.  There is increasing pain in any area of the body.  You have shortness of breath, lightheadedness, dizziness, or fainting.  You have chest pain.  You feel sick to your stomach (nauseous), throw up (vomit), or sweat.  You have increasing abdominal discomfort.  There is blood in your urine, stool, or vomit.  You have pain in your shoulder (shoulder strap areas).  You feel your symptoms are getting worse. MAKE SURE YOU:   Understand these instructions.  Will  watch your condition.  Will get help right away if you are not doing well or get worse. Document Released: 07/08/2005 Document Revised: 09/30/2011 Document Reviewed: 12/05/2010 [facility] Patient Information 2014 [place], [place].  Radial Head Fracture A radial head fracture is a break of the radius bone in the forearm. The radial head is the part of the bone near the elbow. These breaks often happen during a fall when you land on the outstretched arm.  HOME CARE  Raise (elevate) the injured part while sitting or lying down.  Put ice on the injured area.  Put ice in a plastic bag.  Place a towel between your skin and the bag.  Leave the ice on for 15-20 minutes, 03-04 times a day.  Move your fingers.  If you have a plaster or  fiberglass cast:  Do not try to scratch the skin under the cast.  Check the skin around the cast every day. Put lotion on any red or sore areas if needed.  Keep your cast dry and clean.  If you have a plaster splint:  Wear the splint as told.  Loosen the elastic around the splint if your fingers become numb, tingle, or turn cold or blue.  Do not put pressure on any part of your cast or splint. Rest your cast on a pillow for the first 24 hours.  Protect your cast or splint during bathing with a plastic bag. Do not put the cast or splint in water.  Only take medicine as told by your doctor.  Follow up with your doctor. This is important.  Do not over do exercises. GET HELP RIGHT AWAY IF:   Your cast or splint gets damaged or breaks.  You have more pain or puffiness (swelling) than you did before getting the cast.  You have severe pain when stretching your fingers.  There is a bad smell, new stains or yellowish white fluid (pus) coming from under the cast.  Your fingers or hand turn pale, blue, or become cold or lose feeling (numb). MAKE SURE YOU:  Understand these instructions.  Will watch your condition.  Will get help right away if you are not doing well or get worse. Document Released: 06/26/2009 Document Revised: 09/30/2011 Document Reviewed: 06/26/2009 [facility] Patient Information 2014 [place], [place].

## 2013-09-02 ENCOUNTER — Encounter (INDEPENDENT_AMBULATORY_CARE_PROVIDER_SITE_OTHER): Payer: Self-pay | Admitting: Orthopedic Surgery

## 2013-09-02 NOTE — Telephone Encounter (Signed)
Have not received return call -- will send letter.

## 2013-09-05 ENCOUNTER — Emergency Department (HOSPITAL_COMMUNITY)
Admission: EM | Admit: 2013-09-05 | Discharge: 2013-09-05 | Disposition: A | Discharge status: Home / Self Care | Payer: No Typology Code available for payment source | Attending: Emergency Medicine | Admitting: Emergency Medicine

## 2013-09-05 ENCOUNTER — Encounter (HOSPITAL_COMMUNITY): Payer: Self-pay | Admitting: Emergency Medicine

## 2013-09-05 DIAGNOSIS — M25529 Pain in unspecified elbow: Secondary | ICD-10-CM | POA: Insufficient documentation

## 2013-09-05 DIAGNOSIS — F17200 Nicotine dependence, unspecified, uncomplicated: Secondary | ICD-10-CM | POA: Insufficient documentation

## 2013-09-05 DIAGNOSIS — Z87828 Personal history of other (healed) physical injury and trauma: Secondary | ICD-10-CM | POA: Insufficient documentation

## 2013-09-05 DIAGNOSIS — M25521 Pain in right elbow: Secondary | ICD-10-CM

## 2013-09-05 DIAGNOSIS — Z8719 Personal history of other diseases of the digestive system: Secondary | ICD-10-CM | POA: Insufficient documentation

## 2013-09-05 DIAGNOSIS — Z880 Allergy status to penicillin: Secondary | ICD-10-CM | POA: Insufficient documentation

## 2013-09-05 MED ORDER — OXYCODONE HCL 10 MG PO TABS: 10.0000 mg | ORAL_TABLET | ORAL | Status: DC | PRN
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
## 2013-09-05 NOTE — Discharge Instructions (Signed)
Arthralgia  Your caregiver has diagnosed you as suffering from an arthralgia. Arthralgia means there is pain in a joint. This can come from many reasons including:  · Bruising the joint which causes soreness (inflammation) in the joint.  · Wear and tear on the joints which occur as we grow older (osteoarthritis).  · Overusing the joint.  · Various forms of arthritis.  · Infections of the joint.  Regardless of the cause of pain in your joint, most of these different pains respond to anti-inflammatory drugs and rest. The exception to this is when a joint is infected, and these cases are treated with antibiotics, if it is a bacterial infection.  HOME CARE INSTRUCTIONS   · Rest the injured area for as long as directed by your caregiver. Then slowly start using the joint as directed by your caregiver and as the pain allows. Crutches as directed may be useful if the ankles, knees or hips are involved. If the knee was splinted or casted, continue use and care as directed. If an stretchy or elastic wrapping bandage has been applied today, it should be removed and re-applied every 3 to 4 hours. It should not be applied tightly, but firmly enough to keep swelling down. Watch toes and feet for swelling, bluish discoloration, coldness, numbness or excessive pain. If any of these problems (symptoms) occur, remove the ace bandage and re-apply more loosely. If these symptoms persist, contact your caregiver or return to this location.  · For the first 24 hours, keep the injured extremity elevated on pillows while lying down.  · Apply ice for 15-20 minutes to the sore joint every couple hours while awake for the first half day. Then 03-04 times per day for the first 48 hours. Put the ice in a plastic bag and place a towel between the bag of ice and your skin.  · Wear any splinting, casting, elastic bandage applications, or slings as instructed.  · Only take over-the-counter or prescription medicines for pain, discomfort, or fever as  directed by your caregiver. Do not use aspirin immediately after the injury unless instructed by your physician. Aspirin can cause increased bleeding and bruising of the tissues.  · If you were given crutches, continue to use them as instructed and do not resume weight bearing on the sore joint until instructed.  Persistent pain and inability to use the sore joint as directed for more than 2 to 3 days are warning signs indicating that you should see a caregiver for a follow-up visit as soon as possible. Initially, a hairline fracture (break in bone) may not be evident on X-rays. Persistent pain and swelling indicate that further evaluation, non-weight bearing or use of the joint (use of crutches or slings as instructed), or further X-rays are indicated. X-rays may sometimes not show a small fracture until a week or 10 days later. Make a follow-up appointment with your own caregiver or one to whom we have referred you. A radiologist (specialist in reading X-rays) may read your X-rays. Make sure you know how you are to obtain your X-ray results. Do not assume everything is normal if you do not hear from us.  SEEK MEDICAL CARE IF:  Bruising, swelling, or pain increases.  SEEK IMMEDIATE MEDICAL CARE IF:   · Your fingers or toes are numb or blue.  · The pain is not responding to medications and continues to stay the same or get worse.  · The pain in your joint becomes severe.  · You   develop a fever over 102° F (38.9° C).  · It becomes impossible to move or use the joint.  MAKE SURE YOU:   · Understand these instructions.  · Will watch your condition.  · Will get help right away if you are not doing well or get worse.  Document Released: 07/08/2005 Document Revised: 09/30/2011 Document Reviewed: 02/24/2008  ExitCare® Patient Information ©2014 ExitCare, LLC.
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
## 2013-09-06 NOTE — Consult Note (Signed)
I have seen and examined the patient. I agree with the findings above. Continue with sling and AROM, PROM,  AAROM.  Budd PalmerHANDY,Kallyn Demarcus H, MD

## 2013-09-07 ENCOUNTER — Ambulatory Visit: Payer: Self-pay | Admitting: Internal Medicine

## 2013-09-09 NOTE — ED Provider Notes (Signed)
Medical screening examination/treatment/procedure(s) were performed by non-physician practitioner and as supervising physician I was immediately available for consultation/collaboration.  EKG Interpretation   None         Rolland PorterMark Chuong Casebeer, MD 09/09/13 [phone]

## 2013-12-21 ENCOUNTER — Encounter (HOSPITAL_COMMUNITY): Payer: Self-pay | Admitting: Emergency Medicine

## 2013-12-21 ENCOUNTER — Emergency Department (HOSPITAL_COMMUNITY)
Admission: EM | Admit: 2013-12-21 | Discharge: 2013-12-21 | Disposition: A | Discharge status: Home / Self Care | Payer: Self-pay | Attending: Emergency Medicine | Admitting: Emergency Medicine

## 2013-12-21 ENCOUNTER — Emergency Department (HOSPITAL_COMMUNITY): Payer: Self-pay

## 2013-12-21 ENCOUNTER — Emergency Department (HOSPITAL_COMMUNITY): Payer: No Typology Code available for payment source

## 2013-12-21 DIAGNOSIS — Y9389 Activity, other specified: Secondary | ICD-10-CM | POA: Insufficient documentation

## 2013-12-21 DIAGNOSIS — S01309A Unspecified open wound of unspecified ear, initial encounter: Secondary | ICD-10-CM | POA: Insufficient documentation

## 2013-12-21 DIAGNOSIS — F17200 Nicotine dependence, unspecified, uncomplicated: Secondary | ICD-10-CM | POA: Insufficient documentation

## 2013-12-21 DIAGNOSIS — Z87828 Personal history of other (healed) physical injury and trauma: Secondary | ICD-10-CM | POA: Insufficient documentation

## 2013-12-21 DIAGNOSIS — Z880 Allergy status to penicillin: Secondary | ICD-10-CM | POA: Insufficient documentation

## 2013-12-21 DIAGNOSIS — Z8719 Personal history of other diseases of the digestive system: Secondary | ICD-10-CM | POA: Insufficient documentation

## 2013-12-21 DIAGNOSIS — S0100XA Unspecified open wound of scalp, initial encounter: Secondary | ICD-10-CM | POA: Insufficient documentation

## 2013-12-21 DIAGNOSIS — S0990XA Unspecified injury of head, initial encounter: Secondary | ICD-10-CM | POA: Insufficient documentation

## 2013-12-21 DIAGNOSIS — F411 Generalized anxiety disorder: Secondary | ICD-10-CM | POA: Insufficient documentation

## 2013-12-21 DIAGNOSIS — R000 Tachycardia, unspecified: Secondary | ICD-10-CM | POA: Insufficient documentation

## 2013-12-21 DIAGNOSIS — R011 Cardiac murmur, unspecified: Secondary | ICD-10-CM | POA: Insufficient documentation

## 2013-12-21 DIAGNOSIS — R0682 Tachypnea, not elsewhere classified: Secondary | ICD-10-CM | POA: Insufficient documentation

## 2013-12-21 DIAGNOSIS — IMO0002 Reserved for concepts with insufficient information to code with codable children: Secondary | ICD-10-CM | POA: Insufficient documentation

## 2013-12-21 LAB — CBC
HCT: 47.7 % (ref 39.0–52.0)
Hemoglobin: 16.6 g/dL (ref 13.0–17.0)
MCH: 29.9 pg (ref 26.0–34.0)
MCHC: 34.8 g/dL (ref 30.0–36.0)
MCV: 85.9 fL (ref 78.0–100.0)
Platelets: 257 10*3/uL (ref 150–400)
RBC: 5.55 MIL/uL (ref 4.22–5.81)
RDW: 15.1 % (ref 11.5–15.5)
WBC: 14.7 10*3/uL — ABNORMAL HIGH (ref 4.0–10.5)

## 2013-12-21 LAB — COMPREHENSIVE METABOLIC PANEL
ALT: 67 U/L — AB (ref 0–53)
AST: 56 U/L — AB (ref 0–37)
Albumin: 3.6 g/dL (ref 3.5–5.2)
Alkaline Phosphatase: 88 U/L (ref 39–117)
BILIRUBIN TOTAL: 0.4 mg/dL (ref 0.3–1.2)
BUN: 15 mg/dL (ref 6–23)
CHLORIDE: 101 meq/L (ref 96–112)
CO2: 22 mEq/L (ref 19–32)
CREATININE: 1.09 mg/dL (ref 0.50–1.35)
Calcium: 9.1 mg/dL (ref 8.4–10.5)
GFR calc Af Amer: >90 mL/min (ref >90)
GFR calc non Af Amer: 79 mL/min — ABNORMAL LOW (ref >90)
Glucose, Bld: 85 mg/dL (ref 70–99)
Potassium: 3.8 mEq/L (ref 3.7–5.3)
SODIUM: 141 meq/L (ref 137–147)
Total Protein: 7.3 g/dL (ref 6.0–8.3)

## 2013-12-21 LAB — PROTIME-INR
INR: 1.06 (ref 0.00–1.49)
Prothrombin Time: 13.6 seconds (ref 11.6–15.2)

## 2013-12-21 LAB — SAMPLE TO BLOOD BANK

## 2013-12-21 LAB — ETHANOL: Alcohol, Ethyl (B): 147 mg/dL — ABNORMAL HIGH (ref 0–11)

## 2013-12-21 LAB — CDS SEROLOGY

## 2013-12-21 MED ORDER — LIDOCAINE HCL (CARDIAC) 20 MG/ML IV SOLN
INTRAVENOUS | Status: AC
  Filled 2013-12-21: qty 5

## 2013-12-21 MED ORDER — SODIUM CHLORIDE 0.9 % IV SOLN
1000.0000 mL | INTRAVENOUS | Status: DC
  Administered 2013-12-21: 1000 mL via INTRAVENOUS

## 2013-12-21 MED ORDER — FENTANYL CITRATE 0.05 MG/ML IJ SOLN
INTRAMUSCULAR | Status: AC
  Administered 2013-12-21: 50 ug
  Filled 2013-12-21: qty 2

## 2013-12-21 MED ORDER — OXYCODONE HCL 5 MG PO TABS: 5.0000 mg | ORAL_TABLET | ORAL | Status: AC | PRN

## 2013-12-21 MED ORDER — IOHEXOL 300 MG/ML  SOLN: 100.0000 mL | Freq: Once | INTRAMUSCULAR | Status: DC | PRN

## 2013-12-21 MED ORDER — LIDOCAINE HCL (PF) 1 % IJ SOLN
INTRAMUSCULAR | Status: AC
  Filled 2013-12-21: qty 5

## 2013-12-21 MED ORDER — SODIUM CHLORIDE 0.9 % IV SOLN
1000.0000 mL | Freq: Once | INTRAVENOUS | Status: AC
  Administered 2013-12-21: 1000 mL via INTRAVENOUS

## 2013-12-21 MED ORDER — LIDOCAINE-EPINEPHRINE 1 %-1:100000 IJ SOLN
20.0000 mL | Freq: Once | INTRAMUSCULAR | Status: DC
  Filled 2013-12-21: qty 1

## 2013-12-21 MED ORDER — HYDROCODONE-ACETAMINOPHEN 10-325 MG PO TABS: 1.0000 | ORAL_TABLET | Freq: Four times a day (QID) | ORAL | Status: AC | PRN

## 2013-12-21 MED ORDER — HYDROMORPHONE HCL PF 1 MG/ML IJ SOLN
1.0000 mg | INTRAMUSCULAR | Status: DC | PRN
  Administered 2013-12-21 (×3): 1 mg via INTRAVENOUS
  Filled 2013-12-21 (×3): qty 1

## 2013-12-21 NOTE — ED Notes (Signed)
Cleaned pt. wounds on face

## 2013-12-21 NOTE — ED Provider Notes (Signed)
LACERATION REPAIR Performed by: Arnoldo Hooker Authorized by: Arnoldo Hooker Consent: Verbal consent obtained. Risks and benefits: risks, benefits and alternatives were discussed Consent given by: patient Patient identity confirmed: provided demographic data Prepped and Draped in normal sterile fashion Wound explored  Laceration Location: right frontal scalp  Laceration Length: 5cm  No Foreign Bodies seen or palpated  Anesthesia: local infiltration  Local anesthetic: lidocaine 2% w/o epinephrine  Anesthetic total: 3 ml  Irrigation method: syringe Amount of cleaning: standard  Skin closure: 3-0 prolene (posterior 1/2 of laceration involving scalp) and 4-0 prolene (anterior 1/2 of laceration on facial surface)  Number of sutures: 6  Technique: simple interrupted  Patient tolerance: Patient tolerated the procedure well with no immediate complications.   LACERATION REPAIR Performed by: Arnoldo Hooker Authorized by: Arnoldo Hooker Consent: Verbal consent obtained. Risks and benefits: risks, benefits and alternatives were discussed Consent given by: patient Patient identity confirmed: provided demographic data Prepped and Draped in normal sterile fashion Wound explored  Laceration Location: right parietal scalp  Laceration Length: n/a cm - wound is stellate  No Foreign Bodies seen or palpated - irrigated copiously  Anesthesia: local infiltration  Local anesthetic: lidocaine 2 % w/ epinephrine  Anesthetic total: 3 ml  Irrigation method: syringe Amount of cleaning: standard  Skin closure: staples  Number of sutures: 4 staples  Technique: staples   Patient tolerance: Patient tolerated the procedure well with no immediate complications.     Arnoldo Hooker, PA-C 12/21/13 1518  Laceration repair performed with PA Upstill.  I was present for and supervised / assisted with relevant portions of the repair.    Gerhard Munch, MD 12/21/13 1544

## 2013-12-21 NOTE — ED Notes (Signed)
PA at bedside performing suture care. Pt tolerating without difficulty.

## 2013-12-21 NOTE — ED Notes (Signed)
Dr. Lazarus Salines at bedside placing stitches in right ear

## 2013-12-21 NOTE — ED Notes (Signed)
MD at bedside. 

## 2013-12-21 NOTE — ED Notes (Addendum)
Dr.Wolicki, ENT, at bedside
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
## 2013-12-21 NOTE — ED Notes (Signed)
Pt transported to CT scan. Vital signs stable. No signs of distress noted.
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
## 2013-12-21 NOTE — ED Notes (Signed)
Dr. Jeraldine Loots at bedside. Consult placed to ENT for further treatment of lacerations.
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
## 2013-12-21 NOTE — Op Note (Signed)
12/21/2013  5:10 PM    Milosevic, Talmadge [employer]  [id number]   Pre-Op Dx:  RIGHT auricle complex laceration  Post-op Dx: same  Proc: closure, complex stellate RIGHT auricle laceration, 7.5 cm   Surg:  Flo Shanks T MD  Anes:  local  EBL:  min  Comp:  none  Findings:  Stellate laceration through lower anti-helix and anti tragus, through and through lower auricle, with detachment of lobule  from neck skin.  Exposed cartilage of lower anti helix.  Procedure: with the pt in a comfortable supine position, with 1 mg IV Dilaudid for systemic analgesia, the RIGHT auricle was anesthetized using 2% xylocaine with 1:200,000 epinephrine, 23 ml total in stages, beginning with great auricular nerve block, then ring block around the pinna, and finally direct injection adjacent to the several wounds.  Several minutes were allowed for this to take effect.    A Betadine and Saline scrub was performed.   Sterile draping was performed.  The auricle was inspected with the findings as described above.    The posterior skin components of the lacerations were closed with 5-0 Ethilon suture.  The deep soft tissue at the lobule and anti-tragus was closed with 4-0 Vicryl.  Finally, the skin on the lateral surface of the pinna was closed with interrupted 5-0 Ethilon suture.  Good cosmetic closure was noted.  Hemostasis was observed.  The wounds were cleaned and coated with bacitracin ointment.  Dispo:    ER to home  Plan:  Ice, elevation, wound hygiene measures.  Sutures out 7 days.   Cephus Richer MD
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
## 2013-12-22 ENCOUNTER — Encounter (HOSPITAL_COMMUNITY): Payer: Self-pay | Admitting: Emergency Medicine

## 2013-12-22 ENCOUNTER — Emergency Department (HOSPITAL_COMMUNITY)
Admission: EM | Admit: 2013-12-22 | Discharge: 2013-12-22 | Discharge status: Against Medical Advice | Payer: No Typology Code available for payment source | Attending: Emergency Medicine | Admitting: Emergency Medicine

## 2013-12-22 DIAGNOSIS — Y939 Activity, unspecified: Secondary | ICD-10-CM | POA: Insufficient documentation

## 2013-12-22 DIAGNOSIS — F17200 Nicotine dependence, unspecified, uncomplicated: Secondary | ICD-10-CM | POA: Insufficient documentation

## 2013-12-22 DIAGNOSIS — S298XXA Other specified injuries of thorax, initial encounter: Secondary | ICD-10-CM | POA: Insufficient documentation

## 2013-12-22 DIAGNOSIS — S4980XA Other specified injuries of shoulder and upper arm, unspecified arm, initial encounter: Secondary | ICD-10-CM | POA: Insufficient documentation

## 2013-12-22 DIAGNOSIS — S46909A Unspecified injury of unspecified muscle, fascia and tendon at shoulder and upper arm level, unspecified arm, initial encounter: Secondary | ICD-10-CM | POA: Insufficient documentation

## 2013-12-22 DIAGNOSIS — H9210 Otorrhea, unspecified ear: Secondary | ICD-10-CM | POA: Insufficient documentation

## 2013-12-22 DIAGNOSIS — Y92410 Unspecified street and highway as the place of occurrence of the external cause: Secondary | ICD-10-CM | POA: Insufficient documentation

## 2013-12-22 NOTE — ED Notes (Signed)
Pt gave pager to registration and states he was leaving.  Nurse first attempted to speak to pt and pt kept walking and said he was leaving.
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
## 2013-12-23 ENCOUNTER — Emergency Department (HOSPITAL_COMMUNITY)
Admission: EM | Admit: 2013-12-23 | Discharge: 2013-12-23 | Disposition: A | Discharge status: Home / Self Care | Payer: No Typology Code available for payment source | Attending: Emergency Medicine | Admitting: Emergency Medicine

## 2013-12-23 ENCOUNTER — Emergency Department (HOSPITAL_COMMUNITY): Payer: No Typology Code available for payment source

## 2013-12-23 ENCOUNTER — Encounter (HOSPITAL_COMMUNITY): Payer: Self-pay | Admitting: Emergency Medicine

## 2013-12-23 DIAGNOSIS — S20219A Contusion of unspecified front wall of thorax, initial encounter: Secondary | ICD-10-CM | POA: Insufficient documentation

## 2013-12-23 DIAGNOSIS — F911 Conduct disorder, childhood-onset type: Secondary | ICD-10-CM | POA: Insufficient documentation

## 2013-12-23 DIAGNOSIS — S20212A Contusion of left front wall of thorax, initial encounter: Secondary | ICD-10-CM

## 2013-12-23 DIAGNOSIS — F17200 Nicotine dependence, unspecified, uncomplicated: Secondary | ICD-10-CM | POA: Insufficient documentation

## 2013-12-23 DIAGNOSIS — Y9389 Activity, other specified: Secondary | ICD-10-CM | POA: Insufficient documentation

## 2013-12-23 DIAGNOSIS — Z8719 Personal history of other diseases of the digestive system: Secondary | ICD-10-CM | POA: Insufficient documentation

## 2013-12-23 DIAGNOSIS — M25512 Pain in left shoulder: Secondary | ICD-10-CM

## 2013-12-23 DIAGNOSIS — Z880 Allergy status to penicillin: Secondary | ICD-10-CM | POA: Insufficient documentation

## 2013-12-23 DIAGNOSIS — Y92410 Unspecified street and highway as the place of occurrence of the external cause: Secondary | ICD-10-CM | POA: Insufficient documentation

## 2013-12-23 MED ORDER — HYDROCODONE-ACETAMINOPHEN 5-325 MG PO TABS
2.0000 | ORAL_TABLET | Freq: Once | ORAL | Status: DC
  Filled 2013-12-23: qty 2
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
## 2013-12-23 NOTE — ED Provider Notes (Signed)
Medical screening examination/treatment/procedure(s) were performed by non-physician practitioner and as supervising physician I was immediately available for consultation/collaboration.   EKG Interpretation None        Clara Herbison N Jarita Raval, DO 12/23/13 1538 
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
## 2013-12-23 NOTE — ED Notes (Signed)
Robyn, PA at the bedside.  
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
## 2013-12-23 NOTE — ED Notes (Signed)
Patient asleep upon entering the room, had to be woken up for discharge and pain medication adminstration.  Upon speaking with the patient about this medication, he began to talk about how he does not take Norco because of the acetaminophen and his hepatitis C.  During this conversation, patient fell asleep in mid-sentence and it was decided that he was too lethargic to receive any narcotics anyway.

## 2014-03-09 NOTE — ED Provider Notes (Signed)
Addendum to laceration note published 12/21/13:  Regarding length of stellate wound, total laceration length was approximately 4 cm.  Arnoldo HookerShari A Eeva Schlosser, PA-C 03/09/14 1704

## 2014-03-10 NOTE — ED Provider Notes (Signed)
Please see my initial note  Gabriel Munchobert Viren Lebeau, MD 03/10/14 [phone]

## 2020-05-11 ENCOUNTER — Emergency Department (HOSPITAL_COMMUNITY)
Admission: EM | Admit: 2020-05-11 | Discharge: 2020-05-11 | Disposition: A | Discharge status: Home / Self Care | Payer: BLUE CROSS/BLUE SHIELD | Attending: Emergency Medicine | Admitting: Emergency Medicine

## 2020-05-11 ENCOUNTER — Emergency Department (HOSPITAL_COMMUNITY): Payer: BLUE CROSS/BLUE SHIELD

## 2020-05-11 DIAGNOSIS — T401X1A Poisoning by heroin, accidental (unintentional), initial encounter: Secondary | ICD-10-CM | POA: Diagnosis not present

## 2020-05-11 DIAGNOSIS — F17210 Nicotine dependence, cigarettes, uncomplicated: Secondary | ICD-10-CM | POA: Insufficient documentation

## 2020-05-11 LAB — CBC WITH DIFFERENTIAL/PLATELET
Abs Immature Granulocytes: 0.04 10*3/uL (ref 0.00–0.07)
Basophils Absolute: 0 10*3/uL (ref 0.0–0.1)
Basophils Relative: 0 %
Eosinophils Absolute: 0.2 10*3/uL (ref 0.0–0.5)
Eosinophils Relative: 2 %
HCT: 46.3 % (ref 39.0–52.0)
Hemoglobin: 14.8 g/dL (ref 13.0–17.0)
Immature Granulocytes: 0 %
Lymphocytes Relative: 16 %
Lymphs Abs: 1.6 10*3/uL (ref 0.7–4.0)
MCH: 27.7 pg (ref 26.0–34.0)
MCHC: 32 g/dL (ref 30.0–36.0)
MCV: 86.5 fL (ref 80.0–100.0)
Monocytes Absolute: 0.9 10*3/uL (ref 0.1–1.0)
Monocytes Relative: 9 %
Neutro Abs: 7.3 10*3/uL (ref 1.7–7.7)
Neutrophils Relative %: 73 %
Platelets: 355 10*3/uL (ref 150–400)
RBC: 5.35 MIL/uL (ref 4.22–5.81)
RDW: 15 % (ref 11.5–15.5)
WBC: 10.1 10*3/uL (ref 4.0–10.5)
nRBC: 0 % (ref 0.0–0.2)

## 2020-05-11 LAB — BASIC METABOLIC PANEL
Anion gap: 10 (ref 5–15)
BUN: 20 mg/dL (ref 6–20)
CO2: 26 mmol/L (ref 22–32)
Calcium: 9.1 mg/dL (ref 8.9–10.3)
Chloride: 106 mmol/L (ref 98–111)
Creatinine, Ser: 0.85 mg/dL (ref 0.61–1.24)
GFR, Estimated: >60 mL/min (ref >60)
Glucose, Bld: 133 mg/dL — ABNORMAL HIGH (ref 70–99)
Potassium: 3.2 mmol/L — ABNORMAL LOW (ref 3.5–5.1)
Sodium: 142 mmol/L (ref 135–145)

## 2020-05-11 LAB — ETHANOL: Alcohol, Ethyl (B): <10 mg/dL (ref <10)

## 2020-05-11 NOTE — ED Notes (Signed)
Pt alert, eating food and drinking. Provided pt a cab ride home and blue shirt to go home to [place]
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
## 2020-05-11 NOTE — Discharge Instructions (Addendum)
You are seen today after heroin overdose.  You could have died.  You should not use illicit drugs.

## 2021-01-17 IMAGING — DX DG CHEST 1V PORT
1 series · 1 of 1 positions shown · non-contrast
Comparison: Chest radiograph dated 12/23/2013.

CLINICAL DATA: 54-year-old male with chest pain.

EXAM:
PORTABLE CHEST 1 VIEW

[chest]
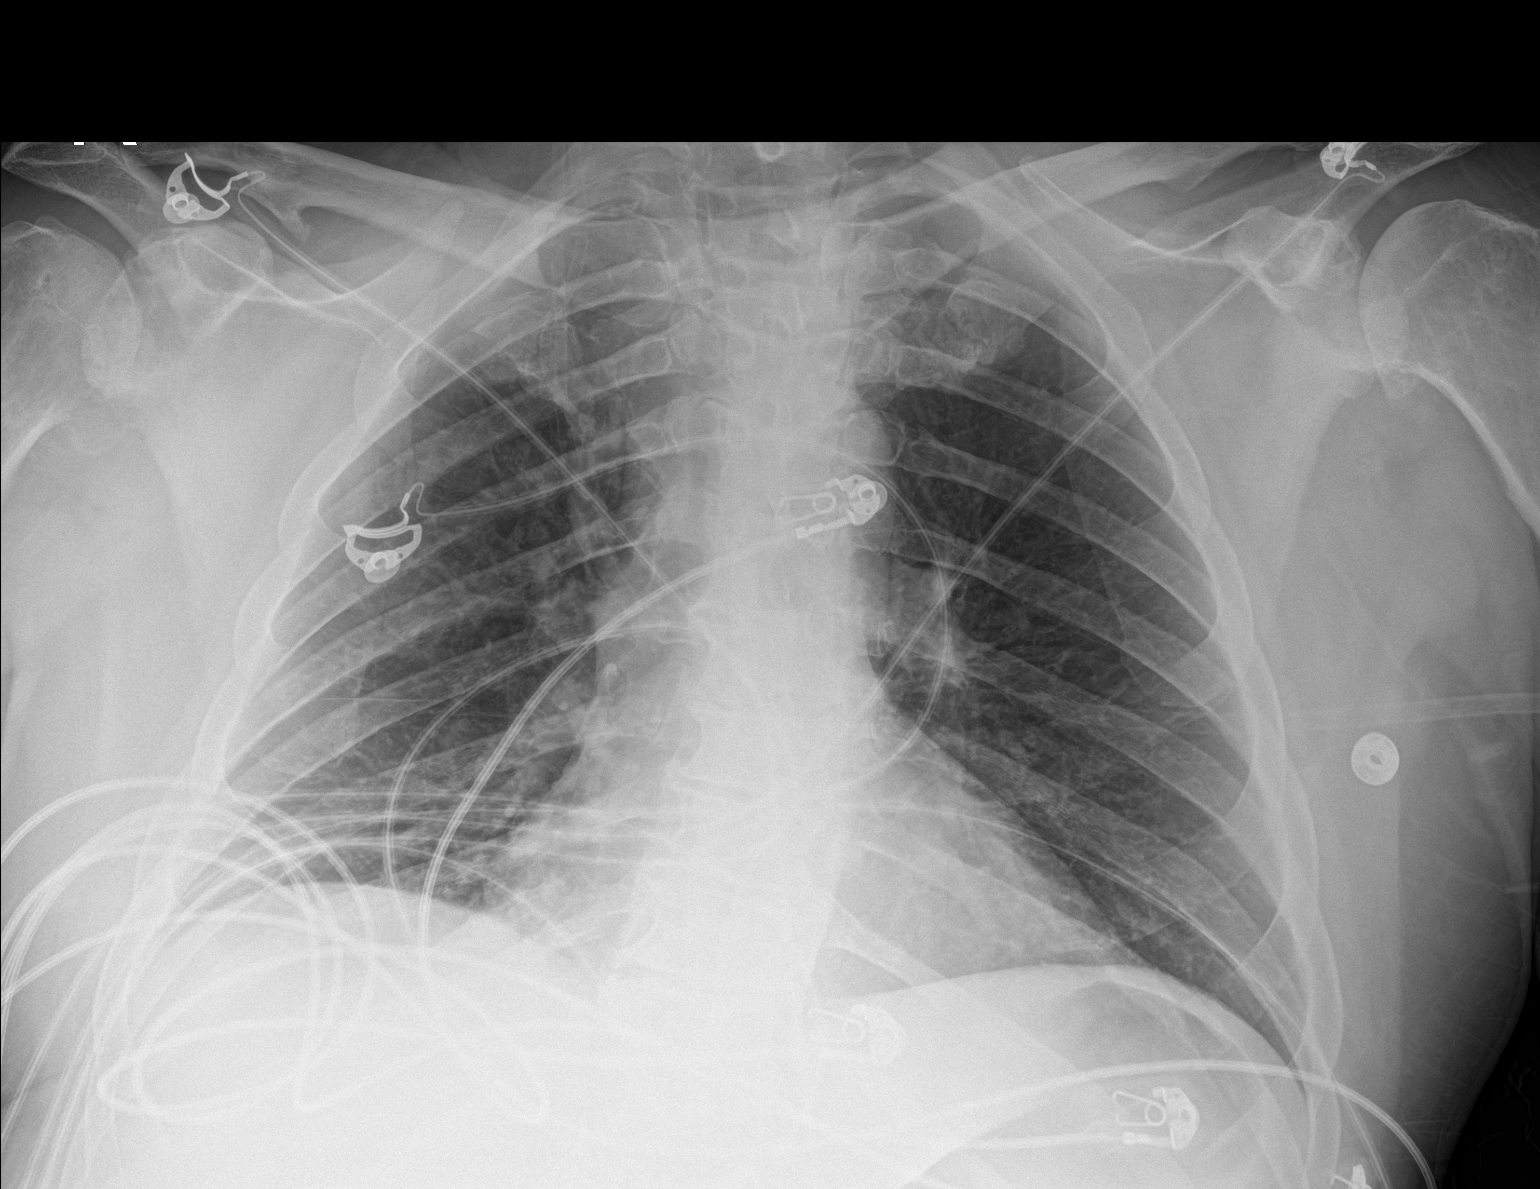

[1 of 1 positions shown; findings below may reference images not displayed]

FINDINGS: No focal consolidation, pleural effusion or pneumothorax. The
cardiac silhouette is within limits. No acute osseous pathology.
IMPRESSION: No active disease.
